# Patient Record
Sex: Male | Born: 1960 | Race: Black or African American | Hispanic: No | Smoking: Never smoker
Health system: Southern US, Community
[De-identification: ages and names within clinical notes are randomized; demographics above are authoritative.]

## PROBLEM LIST (undated history)

## (undated) HISTORY — PX: CYST EXCISION: SHX5701

---

## 2005-12-01 ENCOUNTER — Emergency Department: Payer: Self-pay | Admitting: Internal Medicine

## 2015-02-13 ENCOUNTER — Emergency Department
Admission: EM | Admit: 2015-02-13 | Discharge: 2015-02-13 | Disposition: A | Discharge status: Home / Self Care | Payer: Self-pay | Attending: Emergency Medicine | Admitting: Emergency Medicine

## 2015-02-13 ENCOUNTER — Encounter: Payer: Self-pay | Admitting: *Deleted

## 2015-02-13 DIAGNOSIS — M25551 Pain in right hip: Secondary | ICD-10-CM

## 2015-02-13 DIAGNOSIS — M5431 Sciatica, right side: Secondary | ICD-10-CM

## 2015-02-13 MED ORDER — PREDNISONE 20 MG PO TABS
60.0000 mg | ORAL_TABLET | Freq: Once | ORAL | Status: AC
  Administered 2015-02-13: 60 mg via ORAL
  Filled 2015-02-13: qty 3

## 2015-02-13 MED ORDER — PREDNISONE 10 MG PO TABS: 10.0000 mg | ORAL_TABLET | Freq: Every day | ORAL | Status: DC

## 2015-02-13 MED ORDER — TRAMADOL HCL 50 MG PO TABS: 50.0000 mg | ORAL_TABLET | Freq: Four times a day (QID) | ORAL | Status: AC | PRN

## 2015-02-13 MED ORDER — OXYCODONE-ACETAMINOPHEN 5-325 MG PO TABS
2.0000 | ORAL_TABLET | Freq: Once | ORAL | Status: AC
  Administered 2015-02-13: 2 via ORAL
  Filled 2015-02-13: qty 2

## 2015-02-13 NOTE — ED Notes (Signed)
Pt complains of right leg /hip pain since July

## 2015-02-13 NOTE — ED Provider Notes (Signed)
[facility] Emergency Department Provider Note     Time seen: ----------------------------------------- 7:18 PM on 02/13/2015 -----------------------------------------    I have reviewed the triage vital signs and the nursing notes.   HISTORY  Chief Complaint Leg Pain    HPI Kaniel Kiang is a 54 y.o. male who presents ER with right leg and hip pain since July. Patient states he has some numbness on the distal aspect of his right leg, most the pain is around his right leg and hip. 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   History reviewed. No pertinent past medical history.  There are no active problems to display for this patient.   History reviewed. No pertinent past surgical history.  Allergies Review of patient's allergies indicates no known allergies.  Social History Social History  Substance Use Topics  . Smoking status: Never Smoker   . Smokeless tobacco: None  . Alcohol Use: 7.2 oz/week    12 Cans of beer per week    Review of Systems Constitutional: Negative for fever. Eyes: Negative for visual changes. ENT: Negative for sore throat. Cardiovascular: Negative for chest pain. Respiratory: Negative for shortness of breath. Gastrointestinal: Negative for abdominal pain, vomiting and diarrhea. Genitourinary: Negative for dysuria. Musculoskeletal: Positive for right hip pain Skin: Negative for rash. Neurological: Negative for headaches, focal weakness or numbness.  10-point ROS otherwise negative.  ____________________________________________   PHYSICAL EXAM:  VITAL SIGNS: ED Triage Vitals  Enc Vitals Group     BP 02/13/15 1817 129/83 mmHg     Pulse Rate 02/13/15 1817 103     Resp 02/13/15 1817 20     Temp 02/13/15 1817 98.9 F (37.2 C)     Temp Source 02/13/15 1817 Oral     SpO2 02/13/15 1817 96 %     Weight 02/13/15 1817 195 lb (88.451 kg)     Height 02/13/15 1817  5\' 5"  (1.651 m)     Head Cir --      Peak Flow --      Pain Score 02/13/15 1818 3     Pain Loc --      Pain Edu? --      Excl. in GC? --     Constitutional: Alert and oriented. Well appearing and in no distress. Eyes: Conjunctivae are normal. PERRL. Normal extraocular movements. ENT   Head: Normocephalic and atraumatic.   Nose: No congestion/rhinnorhea.   Mouth/Throat: Mucous membranes are moist.   Neck: No stridor. Cardiovascular: Normal rate, regular rhythm. Normal and symmetric distal pulses are present in all extremities. No murmurs, rubs, or gallops. Respiratory: Normal respiratory effort without tachypnea nor retractions. Breath sounds are clear and equal bilaterally. No wheezes/rales/rhonchi. Gastrointestinal: Soft and nontender. No distention. No abdominal bruits.  Musculoskeletal: Mild pain with range of motion right hip, negative crossed straight leg raise examination. Neurologic:  Normal speech and language. No gross focal neurologic deficits are appreciated. Speech is normal. No gait instability. No sensory deficit is appreciated the right lower extremity Skin:  Skin is warm, dry and intact. No rash noted. Psychiatric: Mood and affect are normal. Speech and behavior are normal. Patient exhibits appropriate insight and judgment.  ____________________________________________  ED COURSE:  Pertinent labs & imaging results that were available during my care of the patient were reviewed by me and considered in my medical decision making (see chart for details). Patient likely with either arthritis or some degree of sciatica. He'll be  treated with steroids and pain medicine  ____________________________________________  FINAL ASSESSMENT AND PLAN  Hip pain, sciatica  Plan: Patient with labs and imaging as dictated above. Patient likely again with either sciatica or arthritis. He's can be discharged with steroids and pain medication. Is advised to follow-up with  his doctor or [facility] in the next week for reevaluation.   Emily Filbert, MD   Emily Filbert, MD 02/13/15 [phone]

## 2015-02-13 NOTE — Discharge Instructions (Signed)
Arthralgia °Your caregiver has diagnosed you as suffering from an arthralgia. Arthralgia means there is pain in a joint. This can come from many reasons including: °· Bruising the joint which causes soreness (inflammation) in the joint. °· Wear and tear on the joints which occur as we grow older (osteoarthritis). °· Overusing the joint. °· Various forms of arthritis. °· Infections of the joint. °Regardless of the cause of pain in your joint, most of these different pains respond to anti-inflammatory drugs and rest. The exception to this is when a joint is infected, and these cases are treated with antibiotics, if it is a bacterial infection. °HOME CARE INSTRUCTIONS  °· Rest the injured area for as long as directed by your caregiver. Then slowly start using the joint as directed by your caregiver and as the pain allows. Crutches as directed may be useful if the ankles, knees or hips are involved. If the knee was splinted or casted, continue use and care as directed. If an stretchy or elastic wrapping bandage has been applied today, it should be removed and re-applied every 3 to 4 hours. It should not be applied tightly, but firmly enough to keep swelling down. Watch toes and feet for swelling, bluish discoloration, coldness, numbness or excessive pain. If any of these problems (symptoms) occur, remove the ace bandage and re-apply more loosely. If these symptoms persist, contact your caregiver or return to this location. °· For the first 24 hours, keep the injured extremity elevated on pillows while lying down. °· Apply ice for 15-20 minutes to the sore joint every couple hours while awake for the first half day. Then 03-04 times per day for the first 48 hours. Put the ice in a plastic bag and place a towel between the bag of ice and your skin. °· Wear any splinting, casting, elastic bandage applications, or slings as instructed. °· Only take over-the-counter or prescription medicines for pain, discomfort, or fever as  directed by your caregiver. Do not use aspirin immediately after the injury unless instructed by your physician. Aspirin can cause increased bleeding and bruising of the tissues. °· If you were given crutches, continue to use them as instructed and do not resume weight bearing on the sore joint until instructed. °Persistent pain and inability to use the sore joint as directed for more than 2 to 3 days are warning signs indicating that you should see a caregiver for a follow-up visit as soon as possible. Initially, a hairline fracture (break in bone) may not be evident on X-rays. Persistent pain and swelling indicate that further evaluation, non-weight bearing or use of the joint (use of crutches or slings as instructed), or further X-rays are indicated. X-rays may sometimes not show a small fracture until a week or 10 days later. Make a follow-up appointment with your own caregiver or one to whom we have referred you. A radiologist (specialist in reading X-rays) may read your X-rays. Make sure you know how you are to obtain your X-ray results. Do not assume everything is normal if you do not hear from us. °SEEK MEDICAL CARE IF: °Bruising, swelling, or pain increases. °SEEK IMMEDIATE MEDICAL CARE IF:  °· Your fingers or toes are numb or blue. °· The pain is not responding to medications and continues to stay the same or get worse. °· The pain in your joint becomes severe. °· You develop a fever over 102° F (38.9° C). °· It becomes impossible to move or use the joint. °MAKE SURE YOU:  °·   Understand these instructions. °· Will watch your condition. °· Will get help right away if you are not doing well or get worse. °Document Released: 05/26/2005 Document Revised: 08/18/2011 Document Reviewed: 01/12/2008 °ExitCare® Patient Information ©2015 [employer]. This information is not intended to replace advice given to you by your health care provider. Make sure you discuss any questions you have with your health care  provider. °Sciatica °Sciatica is pain, weakness, numbness, or tingling along the path of the sciatic nerve. The nerve starts in the lower back and runs down the back of each leg. The nerve controls the muscles in the lower leg and in the back of the knee, while also providing sensation to the back of the thigh, lower leg, and the sole of your foot. Sciatica is a symptom of another medical condition. For instance, nerve damage or certain conditions, such as a herniated disk or bone spur on the spine, pinch or put pressure on the sciatic nerve. This causes the pain, weakness, or other sensations normally associated with sciatica. Generally, sciatica only affects one side of the body. °CAUSES  °· Herniated or slipped disc. °· Degenerative disk disease. °· A pain disorder involving the narrow muscle in the buttocks (piriformis syndrome). °· Pelvic injury or fracture. °· Pregnancy. °· Tumor (rare). °SYMPTOMS  °Symptoms can vary from mild to very severe. The symptoms usually travel from the low back to the buttocks and down the back of the leg. Symptoms can include: °· Mild tingling or dull aches in the lower back, leg, or hip. °· Numbness in the back of the calf or sole of the foot. °· Burning sensations in the lower back, leg, or hip. °· Sharp pains in the lower back, leg, or hip. °· Leg weakness. °· Severe back pain inhibiting movement. °These symptoms may get worse with coughing, sneezing, laughing, or prolonged sitting or standing. Also, being overweight may worsen symptoms. °DIAGNOSIS  °Your caregiver will perform a physical exam to look for common symptoms of sciatica. He or she may ask you to do certain movements or activities that would trigger sciatic nerve pain. Other tests may be performed to find the cause of the sciatica. These may include: °· Blood tests. °· X-rays. °· Imaging tests, such as an MRI or CT scan. °TREATMENT  °Treatment is directed at the cause of the sciatic pain. Sometimes, treatment is not  necessary and the pain and discomfort goes away on its own. If treatment is needed, your caregiver may suggest: °· Over-the-counter medicines to relieve pain. °· Prescription medicines, such as anti-inflammatory medicine, muscle relaxants, or narcotics. °· Applying heat or ice to the painful area. °· Steroid injections to lessen pain, irritation, and inflammation around the nerve. °· Reducing activity during periods of pain. °· Exercising and stretching to strengthen your abdomen and improve flexibility of your spine. Your caregiver may suggest losing weight if the extra weight makes the back pain worse. °· Physical therapy. °· Surgery to eliminate what is pressing or pinching the nerve, such as a bone spur or part of a herniated disk. °HOME CARE INSTRUCTIONS  °· Only take over-the-counter or prescription medicines for pain or discomfort as directed by your caregiver. °· Apply ice to the affected area for 20 minutes, 3-4 times a day for the first 48-72 hours. Then try heat in the same way. °· Exercise, stretch, or perform your usual activities if these do not aggravate your pain. °· Attend physical therapy sessions as directed by your caregiver. °· Keep all   follow-up appointments as directed by your caregiver. °· Do not wear high heels or shoes that do not provide proper support. °· Check your mattress to see if it is too soft. A firm mattress may lessen your pain and discomfort. °SEEK IMMEDIATE MEDICAL CARE IF:  °· You lose control of your bowel or bladder (incontinence). °· You have increasing weakness in the lower back, pelvis, buttocks, or legs. °· You have redness or swelling of your back. °· You have a burning sensation when you urinate. °· You have pain that gets worse when you lie down or awakens you at night. °· Your pain is worse than you have experienced in the past. °· Your pain is lasting longer than 4 weeks. °· You are suddenly losing weight without reason. °MAKE SURE YOU: °· Understand these  instructions. °· Will watch your condition. °· Will get help right away if you are not doing well or get worse. °Document Released: 05/20/2001 Document Revised: 11/25/2011 Document Reviewed: 10/05/2011 °ExitCare® Patient Information ©2015 [employer]. This information is not intended to replace advice given to you by your health care provider. Make sure you discuss any questions you have with your health care provider. ° °

## 2015-02-20 ENCOUNTER — Emergency Department: Payer: Self-pay

## 2015-02-20 ENCOUNTER — Encounter: Payer: Self-pay | Admitting: Emergency Medicine

## 2015-02-20 ENCOUNTER — Emergency Department
Admission: EM | Admit: 2015-02-20 | Discharge: 2015-02-20 | Disposition: A | Discharge status: Home / Self Care | Payer: Self-pay | Attending: Emergency Medicine | Admitting: Emergency Medicine

## 2015-02-20 DIAGNOSIS — S86911A Strain of unspecified muscle(s) and tendon(s) at lower leg level, right leg, initial encounter: Secondary | ICD-10-CM | POA: Insufficient documentation

## 2015-02-20 DIAGNOSIS — S86111A Strain of other muscle(s) and tendon(s) of posterior muscle group at lower leg level, right leg, initial encounter: Secondary | ICD-10-CM

## 2015-02-20 DIAGNOSIS — Y998 Other external cause status: Secondary | ICD-10-CM | POA: Insufficient documentation

## 2015-02-20 DIAGNOSIS — Y9289 Other specified places as the place of occurrence of the external cause: Secondary | ICD-10-CM | POA: Insufficient documentation

## 2015-02-20 DIAGNOSIS — Y9389 Activity, other specified: Secondary | ICD-10-CM | POA: Insufficient documentation

## 2015-02-20 DIAGNOSIS — Z7952 Long term (current) use of systemic steroids: Secondary | ICD-10-CM | POA: Insufficient documentation

## 2015-02-20 DIAGNOSIS — X58XXXA Exposure to other specified factors, initial encounter: Secondary | ICD-10-CM | POA: Insufficient documentation

## 2015-02-20 MED ORDER — CYCLOBENZAPRINE HCL 10 MG PO TABS: 10.0000 mg | ORAL_TABLET | Freq: Three times a day (TID) | ORAL | Status: AC | PRN

## 2015-02-20 MED ORDER — TRAMADOL HCL 50 MG PO TABS: 50.0000 mg | ORAL_TABLET | Freq: Four times a day (QID) | ORAL | Status: AC | PRN

## 2015-02-20 NOTE — ED Notes (Signed)
Pt to ed with c/o right hip and leg pain and tingling in leg x 2 months.  Pt reports mild swelling noted in right leg.

## 2015-02-20 NOTE — ED Provider Notes (Signed)
[facility] Emergency Department Provider Note  ____________________________________________  Time seen: Approximately 1:33 PM  I have reviewed the triage vital signs and the nursing notes.   HISTORY  Chief Complaint Leg Pain    HPI Marvin Howard is a 54 y.o. male patient complain of edema and pain to the right calf for 2 days. 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 Patient's had 2 months of right hip and leg pain and tingling in the right lower extremity.  . Patient stated he was unable to follow-up as directed from his last exam because he did not have his [employer] identification card. Patient state he has not obtained a car like a follow-up with her family doctor after this visit.Marvin Howard   History reviewed. No pertinent past medical history.  There are no active problems to display for this patient.   History reviewed. No pertinent past surgical history.  Current Outpatient Rx  Name  Route  Sig  Dispense  Refill  . cyclobenzaprine (FLEXERIL) 10 MG tablet   Oral   Take 1 tablet (10 mg total) by mouth every 8 (eight) hours as needed for muscle spasms.   15 tablet   0   . predniSONE (DELTASONE) 10 MG tablet   Oral   Take 1 tablet (10 mg total) by mouth daily.   21 tablet   0     Take 60mg  tomorrow, then decrease by 10mg  daily un ...   . traMADol (ULTRAM) 50 MG tablet   Oral   Take 1 tablet (50 mg total) by mouth every 6 (six) hours as needed.   20 tablet   0   . traMADol (ULTRAM) 50 MG tablet   Oral   Take 1 tablet (50 mg total) by mouth every 6 (six) hours as needed for moderate pain.   12 tablet   0     Allergies Review of patient's allergies indicates no known allergies.  History reviewed. No pertinent family history.  Social History Social History  Substance Use Topics  . Smoking status: Never Smoker   . Smokeless tobacco: None  . Alcohol Use: 7.2 oz/week    12 Cans of beer per week     Review of Systems Constitutional: No fever/chills Eyes: No visual changes. ENT: No sore throat. Cardiovascular: Denies chest pain. Respiratory: Denies shortness of breath. Gastrointestinal: No abdominal pain.  No nausea, no vomiting.  No diarrhea.  No constipation. Genitourinary: Negative for dysuria. Musculoskeletal: Negative for back pain. Skin: Negative for rash. Neurological: Negative for headaches, focal weakness or numbness.  10-point ROS otherwise negative.  ____________________________________________   PHYSICAL EXAM:  VITAL SIGNS: ED Triage Vitals  Enc Vitals Group     BP 02/20/15 1248 136/88 mmHg     Pulse Rate 02/20/15 1246 94     Resp 02/20/15 1248 20     Temp 02/20/15 1246 98.9 F (37.2 C)     Temp Source 02/20/15 1246 Oral     SpO2 02/20/15 1248 98 %     Weight 02/20/15 1248 180 lb (81.647 kg)     Height 02/20/15 1248 5\' 4"  (1.626 m)     Head Cir --      Peak Flow --      Pain Score 02/20/15 1249 10     Pain Loc --      Pain Edu? --      Excl. in GC? --     Constitutional: Alert and oriented. Well appearing and  in no acute distress. Eyes: Conjunctivae are normal. PERRL. EOMI. Head: Atraumatic. Nose: No congestion/rhinnorhea. Mouth/Throat: Mucous membranes are moist.  Oropharynx non-erythematous. Neck: No stridor.   Cardiovascular: Normal rate, regular rhythm. Grossly normal heart sounds.  Good peripheral circulation. Respiratory: Normal respiratory effort.  No retractions. Lungs CTAB. Gastrointestinal: Soft and nontender. No distention. No abdominal bruits. No CVA tenderness. Musculoskeletal: No obvious deformity there is some moderate edema to the lower right extremity. Patient . Patient has some guarding with palpation of the right calf. Distal pulses were intact. Neurologic:  Normal speech and language. No gross focal neurologic deficits are appreciated. No gait instability. Skin:  Skin is warm, dry and intact. No rash noted. Psychiatric: Mood  and affect are normal. Speech and behavior are normal.  ____________________________________________   LABS (all labs ordered are listed, but only abnormal results are displayed)  Labs Reviewed - No data to display ____________________________________________  EKG   ____________________________________________  RADIOLOGY  Venous ultrasound was negative for DVT. ____________________________________________   PROCEDURES  Procedure(s) performed: None  Critical Care performed: No  ____________________________________________   INITIAL IMPRESSION / ASSESSMENT AND PLAN / ED COURSE  Pertinent labs & imaging results that were available during my care of the patient were reviewed by me and considered in my medical decision making (see chart for details).  _______Gastrocnemius  Muscle strain right leg.patient given advice on home care. Patient given a prescription for Flexeril and tramadol. He shouldn't work note for 2 days. Patient is advised to follow-up with [facility] clinic. _____________________________________   FINAL CLINICAL IMPRESSION(S) / ED DIAGNOSES  Final diagnoses:  Gastrocnemius muscle strain, right, initial encounter      Marvin Reining, PA-C 02/20/15 1512  Marvin Natal, MD 02/20/15 [phone]

## 2015-02-27 ENCOUNTER — Other Ambulatory Visit: Payer: Self-pay | Admitting: Physician Assistant

## 2015-02-27 DIAGNOSIS — R200 Anesthesia of skin: Secondary | ICD-10-CM

## 2015-02-27 DIAGNOSIS — M79604 Pain in right leg: Secondary | ICD-10-CM

## 2015-02-27 DIAGNOSIS — M545 Low back pain: Secondary | ICD-10-CM

## 2015-02-27 DIAGNOSIS — R202 Paresthesia of skin: Secondary | ICD-10-CM

## 2015-03-05 ENCOUNTER — Ambulatory Visit
Admission: RE | Admit: 2015-03-05 | Discharge: 2015-03-05 | Disposition: A | Discharge status: Home / Self Care | Payer: BLUE CROSS/BLUE SHIELD | Source: Ambulatory Visit | Attending: Physician Assistant | Admitting: Physician Assistant

## 2015-03-05 ENCOUNTER — Ambulatory Visit: Payer: BLUE CROSS/BLUE SHIELD

## 2015-03-05 DIAGNOSIS — M79604 Pain in right leg: Secondary | ICD-10-CM

## 2015-03-05 DIAGNOSIS — R202 Paresthesia of skin: Secondary | ICD-10-CM

## 2015-03-05 DIAGNOSIS — M545 Low back pain: Secondary | ICD-10-CM

## 2015-03-05 DIAGNOSIS — M5126 Other intervertebral disc displacement, lumbar region: Secondary | ICD-10-CM | POA: Insufficient documentation

## 2015-03-05 DIAGNOSIS — R200 Anesthesia of skin: Secondary | ICD-10-CM

## 2015-09-17 ENCOUNTER — Emergency Department: Payer: BLUE CROSS/BLUE SHIELD

## 2015-09-17 ENCOUNTER — Emergency Department
Admission: EM | Admit: 2015-09-17 | Discharge: 2015-09-17 | Disposition: A | Discharge status: Home / Self Care | Payer: BLUE CROSS/BLUE SHIELD | Attending: Emergency Medicine | Admitting: Emergency Medicine

## 2015-09-17 DIAGNOSIS — M7652 Patellar tendinitis, left knee: Secondary | ICD-10-CM | POA: Insufficient documentation

## 2015-09-17 DIAGNOSIS — M25562 Pain in left knee: Secondary | ICD-10-CM | POA: Diagnosis present

## 2015-09-17 MED ORDER — PREDNISONE 10 MG PO TABS: ORAL_TABLET | ORAL | Status: AC

## 2015-09-17 NOTE — ED Notes (Signed)
Pt c/o pain and swelling to the left knee for the past 2 weeks.. Denies injury..[name]

## 2015-09-17 NOTE — ED Provider Notes (Signed)
[facility] Emergency Department Provider Note  ____________________________________________  Time seen: Approximately 2:34 PM  I have reviewed the triage vital signs and the nursing notes.   HISTORY  Chief Complaint Knee Pain    HPI Marvin Howard is a 55 y.o. male , NAD, presents to the emergency department with 2 week history of swelling and pain to the left knee. Denies any injury, trauma, falls. Has not noted any redness or warmth. No skin lesions. Denies fever, chills, body aches. Denies numbness, weakness, tingling. States the area hurts more when he applies weight to that extremity. Has not taken anything over-the-counter for pain at this time. Has completed no home treatment at this time.   History reviewed. No pertinent past medical history.  There are no active problems to display for this patient.   Past Surgical History  Procedure Laterality Date  . Cyst excision      Current Outpatient Rx  Name  Route  Sig  Dispense  Refill  . cyclobenzaprine (FLEXERIL) 10 MG tablet   Oral   Take 1 tablet (10 mg total) by mouth every 8 (eight) hours as needed for muscle spasms.   15 tablet   0   . predniSONE (DELTASONE) 10 MG tablet      Take a daily regimen of 6,5,4,3,2,1   21 tablet   0   . traMADol (ULTRAM) 50 MG tablet   Oral   Take 1 tablet (50 mg total) by mouth every 6 (six) hours as needed.   20 tablet   0   . traMADol (ULTRAM) 50 MG tablet   Oral   Take 1 tablet (50 mg total) by mouth every 6 (six) hours as needed for moderate pain.   12 tablet   0     Allergies Review of patient's allergies indicates no known allergies.  No family history on file.  Social History Social History  Substance Use Topics  . Smoking status: Never Smoker   . Smokeless tobacco: None  . Alcohol Use: 7.2 oz/week    12 Cans of beer per week     Review of Systems  Constitutional: No fever/chills, fatigue Cardiovascular: No chest  pain. Respiratory:  No shortness of breath.  Musculoskeletal: Positive left knee pain.  Skin: Positive swelling left knee. Negative for rash, redness, warmth, skin sores. Neurological: Negative for headaches, focal weakness or numbness. No tingling 10-point ROS otherwise negative.  ____________________________________________   PHYSICAL EXAM:  VITAL SIGNS: ED Triage Vitals  Enc Vitals Group     BP 09/17/15 1332 140/92 mmHg     Pulse Rate 09/17/15 1332 100     Resp 09/17/15 1332 17     Temp 09/17/15 1332 98.2 F (36.8 C)     Temp Source 09/17/15 1332 Oral     SpO2 09/17/15 1332 97 %     Weight 09/17/15 1332 210 lb (95.255 kg)     Height 09/17/15 1332 5\' 6"  (1.676 m)     Head Cir --      Peak Flow --      Pain Score 09/17/15 1333 8     Pain Loc --      Pain Edu? --      Excl. in GC? --     Constitutional: Alert and oriented. Well appearing and in no acute distress. Eyes: Conjunctivae are normal.  Head: Atraumatic. Cardiovascular:  Good peripheral circulation with 2+ pulses noted in the left lower extremity. Respiratory: Normal respiratory effort without tachypnea or  retractions.  Musculoskeletal: 2 cm oblong swelling below the left patella. Slightly warm to palpation but no fluctuance or induration. No crepitus or pain with manipulation of the left patella. FROM of the left knee without crepitus with manipulation. Negative anterior and posterior drawer. No laxity with varus or valgus stress. No lower extremity tenderness nor edema.  No joint effusions. Neurologic:  Normal speech and language. No gross focal neurologic deficits are appreciated.  Skin:  Skin is warm, dry and intact. No rash or skin sores, oozing, weeping noted. Psychiatric: Mood and affect are normal. Speech and behavior are normal. Patient exhibits appropriate insight and judgement.   ____________________________________________    LABS  None  ____________________________________________  EKG  None ____________________________________________  RADIOLOGY I have personally viewed and evaluated these images (plain radiographs) as part of my medical decision making, as well as reviewing the written report by the radiologist.  Dg Knee Complete 4 Views Left  09/17/2015  CLINICAL DATA:  Pain and swelling in the left knee for 2 weeks. No known injury. Increased pain with weight-bearing. EXAM: LEFT KNEE - COMPLETE 4+ VIEW COMPARISON:  None. FINDINGS: The bones appear normal. There is soft tissue prominence in the region of the patellar tendon and of the anterior tibial tubercle. No joint effusion. IMPRESSION: Soft tissue swelling along the patellar tendon and at the patellar tendon insertion. The finding suggests patellar tendinopathy. Electronically Signed   By: Francene Boyers M.D.   On: 09/17/2015 15:11    ____________________________________________    PROCEDURES  Procedure(s) performed: None     Medications - No data to display   ____________________________________________   INITIAL IMPRESSION / ASSESSMENT AND PLAN / ED COURSE  Pertinent imaging results that were available during my care of the patient were reviewed by me and considered in my medical decision making (see chart for details).  Patient's diagnosis is consistent with left patellar tendinitis. Patient was placed in an Ace wrap for support. Patient will be discharged home with prescriptions for prednisone to take as directed. May take Tylenol as needed for pain. Patient is to follow up with Dr. Ann Lions in orthopedics in 2-3 days if no improvement has been noted. Patient was given a work note to excuse from work today and tomorrow to allow him to rest the knee as well as to ice and elevate it at home. Patient is given ED precautions to return to the ED for any worsening or new symptoms.       ____________________________________________  FINAL CLINICAL IMPRESSION(S) / ED DIAGNOSES  Final diagnoses:  Patellar tendinitis, left      NEW MEDICATIONS STARTED DURING THIS VISIT:  Discharge Medication List as of 09/17/2015  3:21 PM           Hope Pigeon, PA-C 09/17/15 1609  Jene Every, MD 09/18/15 1442

## 2015-09-17 NOTE — Discharge Instructions (Signed)
Patellar Tendinitis With Rehab  Tendinitis is inflammation of a tendon. Tendonitis of the tendon below the kneecap (patella) is known as patellar tendonitis. Patellar tendonitis is also called jumper's knee. Jumper's knee is a common cause of pain below the kneecap (infrapatellar). Jumper's knee may involve a tear (strain) in the ligament. Strains are classified into three categories. Grade 1 strains cause pain, but the tendon is not lengthened. Grade 2 strains include a lengthened ligament, due to the ligament being stretched or partially ruptured. With grade 2 strains there is still function, although function may be decreased. Grade 3 strains involve a complete tear of the tendon or muscle, and function is usually impaired. Patellar tendon strains are usually grade 1 or 2.   SYMPTOMS   · Pain, tenderness, swelling, warmth, or redness over the patellar tendon (just below the kneecap).  · Pain and loss of strength (sometimes), with forcefully straightening the knee (especially when jumping or rising from a seated or squatting position), or bending the knee completely (squatting or kneeling).  · Crackling sound (crepitation) when the tendon is moved or touched.  CAUSES   Patellar tendonitis is caused by injury to the patellar tendon. The inflammation is the body's healing response. Common causes of injury include:  · Stress from a sudden increase in intensity, frequency, or duration of training.  · Overuse of the thigh muscles (quadriceps) and patellar tendon.  · Direct hit (trauma) to the knee or patellar tendon.  RISK INCREASES WITH:  · Sports that require sudden, explosive quadriceps contraction, such as jumping, quick starts, or kicking.  · Running sports, especially running down hills.  · Poor strength and flexibility of the thigh and knee.  · Flat feet.  PREVENTION  · Warm up and stretch properly before activity.  · Allow for adequate recovery between workouts.  · Maintain physical fitness:    Strength,  flexibility, and endurance.    Cardiovascular fitness.  · Protect the knee joint with taping, protective strapping, bracing, or elastic compression bandage.  · Wear arch supports (orthotics).  PROGNOSIS   If treated properly, patellar tendonitis usually heals within 6 weeks.   RELATED COMPLICATIONS   · Longer healing time if not properly treated or if not given enough time to heal.  · Recurring symptoms if activity is resumed too soon, with overuse, with a direct blow, or when using poor technique.  · If untreated, tendon rupture requiring surgery.  TREATMENT  Treatment first involves the use of ice and medicine to reduce pain and inflammation. The use of strengthening and stretching exercises may help reduce pain with activity. These exercises may be performed at home or with a therapist. Serious cases of tendonitis may require restraining the knee for 10 to 14 days to prevent stress on the tendon and to promote healing. Crutches may be used (uncommon) until you can walk without a limp. For cases in which nonsurgical treatment is unsuccessful, surgery may be advised to remove the inflamed tendon lining (sheath). Surgery is rare, and is only advised after at least 6 months of nonsurgical treatment.  MEDICATION   · If pain medicine is needed, nonsteroidal anti-inflammatory medicines (aspirin and ibuprofen), or other minor pain relievers (acetaminophen), are often advised.  · Do not take pain medicine for 7 days before surgery.  · Prescription pain relievers may be given if your caregiver thinks they are needed. Use only as directed and only as much as you need.  HEAT AND COLD  · Cold treatment (icing)   should be applied for 10 to 15 minutes every 2 to 3 hours for inflammation and pain, and immediately after activity that aggravates your symptoms. Use ice packs or an ice massage.  · Heat treatment may be used before performing stretching and strengthening activities prescribed by your caregiver, physical therapist, or  athletic trainer. Use a heat pack or a warm water soak.  SEEK MEDICAL CARE IF:  · Symptoms get worse or do not improve in 2 weeks, despite treatment.  · New, unexplained symptoms develop. (Drugs used in treatment may produce side effects.)  EXERCISES  RANGE OF MOTION (ROM) AND STRETCHING EXERCISES - Patellar Tendinitis (Jumper's Knee)  These are some of the initial exercises with which you may start your rehabilitation program, until you see your caregiver again or until your symptoms are resolved. Remember:   · Flexible tissue is more tolerant of the stresses placed on it during activities.  · Each stretch should be held for 20 to 30 seconds.  · A gentle stretching sensation should be felt.  STRETCH - Hamstrings, Supine  · Lie on your back. Loop a belt or towel over the ball of your right / left foot.  · Straighten your right / left knee and slowly pull on the belt to raise your leg. Do not allow the right / left knee to bend. Keep your opposite leg flat on the floor.  · Raise the leg until you feel a gentle stretch behind your right / left knee or thigh. Hold this position for __________ seconds.  Repeat __________ times. Complete this stretch __________ times per day.   STRETCH - Hamstrings, Doorway  · Lie on your back with your right / left leg extended and resting on the wall, and the opposite leg flat on the ground through the door. At first, position your bottom farther away from the wall.  · Keep your right / left knee straight. If you feel a stretch behind your knee or thigh, hold this position for __________ seconds.  · If you do not feel a stretch, scoot your bottom closer to the door, and hold __________ seconds.  Repeat __________ times. Complete this stretch __________ times per day.   STRETCH - Hamstrings, Standing  · Stand or sit and extend your right / left leg, placing your foot on a chair or foot stool.  · Keep a slight arch in your low back and your hips straight forward.  · Lead with your chest  and lean forward at the waist until you feel a gentle stretch in the back of your right / left knee or thigh. (When done correctly, this exercise requires leaning only a small distance.)  · Hold this position for __________ seconds.  Repeat __________ times. Complete this stretch __________ times per day.  STRETCH - Adductors, Lunge  · While standing, spread your legs, with your right / left leg behind you.  · Lean away from your right / left leg by bending your opposite knee. You may rest your hands on your thigh for balance.  · You should feel a stretch in your right / left inner thigh. Hold for __________ seconds.  Repeat __________ times. Complete this exercise __________ times per day.   STRENGTHENING EXERCISES - Patellar Tendinitis (Jumper's Knee)  These exercises may help you when beginning to rehabilitate your injury. They may resolve your symptoms with or without further involvement from your physician, physical therapist or athletic trainer. While completing these exercises, remember:   · Muscles   can gain both the endurance and the strength needed for everyday activities through controlled exercises.  · Complete these exercises as instructed by your physician, physical therapist or athletic trainer. Increase the resistance and repetitions only as guided by your caregiver.  STRENGTH - Quadriceps, Isometrics  · Lie on your back with your right / left leg extended and your opposite knee bent.  · Gradually tense the muscles in the front of your right / left thigh. You should see either your kneecap slide up toward your hip or increased dimpling just above the knee. This motion will push the back of the knee down toward the floor, mat, or bed on which you are lying.  · Hold the muscle as tight as you can, without increasing your pain, for __________ seconds.  · Relax the muscles slowly and completely in between each repetition.  Repeat __________ times. Complete this exercise __________ times per day.      STRENGTH - Quadriceps, Short Arcs  · Lie on your back. Place a __________ inch towel roll under your right / left knee, so that the knee bends slightly.  · Raise only your lower leg by tightening the muscles in the front of your thigh. Do not allow your thigh to rise.  · Hold this position for __________ seconds.  Repeat __________ times. Complete this exercise __________ times per day.   OPTIONAL ANKLE WEIGHTS: Begin with ____________________, but DO NOT exceed ____________________. Increase in 1 pound/ 0.5 kilogram increments.  STRENGTH - Quadriceps, Straight Leg Raises   Quality counts! Watch for signs that the quadriceps muscle is working, to be sure you are strengthening the correct muscles and not "cheating" by substituting with healthier muscles.  · Lay on your back with your right / left leg extended and your opposite knee bent.  · Tense the muscles in the front of your right / left thigh. You should see either your kneecap slide up or increased dimpling just above the knee. Your thigh may even shake a bit.  · Tighten these muscles even more and raise your leg 4 to 6 inches off the floor. Hold for __________ seconds.  · Keeping these muscles tense, lower your leg.  · Relax the muscles slowly and completely between each repetition.  Repeat __________ times. Complete this exercise __________ times per day.   STRENGTH - Quadriceps, Squats  · Stand in a door frame so that your feet and knees are in line with the frame.  · Use your hands for balance, not support, on the frame.  · Slowly lower your weight, bending at the hips and knees. Keep your lower legs upright so that they are parallel with the door frame. Squat only within the range that does not increase your knee pain. Never let your hips drop below your knees.  · Slowly return upright, pushing with your legs, not pulling with your hands.  Repeat __________ times. Complete this exercise __________ times per day.   STRENGTH - Quadriceps,  Step-Downs  · Stand on the edge of a step stool or stair. Be prepared to use a countertop or wall for balance, if needed.  · Keeping your right / left knee directly over the middle of your foot, slowly touch your opposite heel to the floor or lower step. Do not go all the way to the floor if your knee pain increases; just go as far as you can without increased discomfort. Use your right / left leg muscles, not gravity to lower your   body weight.  · Slowly push your body weight back up to the starting position.  Repeat __________ times. Complete this exercise __________ times per day.      This information is not intended to replace advice given to you by your health care provider. Make sure you discuss any questions you have with your health care provider.     Document Released: 05/26/2005 Document Revised: 10/10/2014 Document Reviewed: 09/07/2008  [employer]vier Interactive Patient Education ©2016 [employer]

## 2017-06-09 IMAGING — US US EXTREM LOW VENOUS*R*
1 series · 14 of 24 positions shown · non-contrast
Comparison: None.

CLINICAL DATA: Right pain and calf edema

EXAM:
RIGHT LOWER EXTREMITY VENOUS DUPLEX ULTRASOUND
TECHNIQUE: Doppler venous assessment of the right lower extremity deep venous
system was performed, including characterization of spectral flow,
compressibility, and phasicity.

[Series 1: us extrem low venous*right* · 0.08mm/px · 14 of 33 slices shown]
[im 1/33]
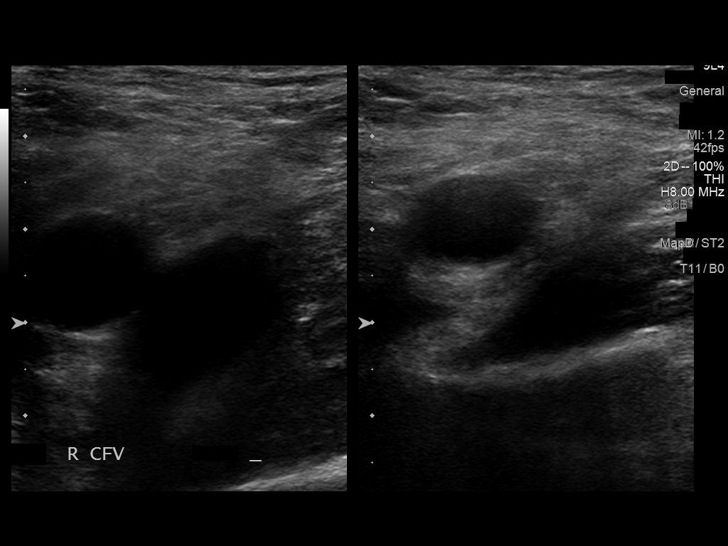
[im 3/33]
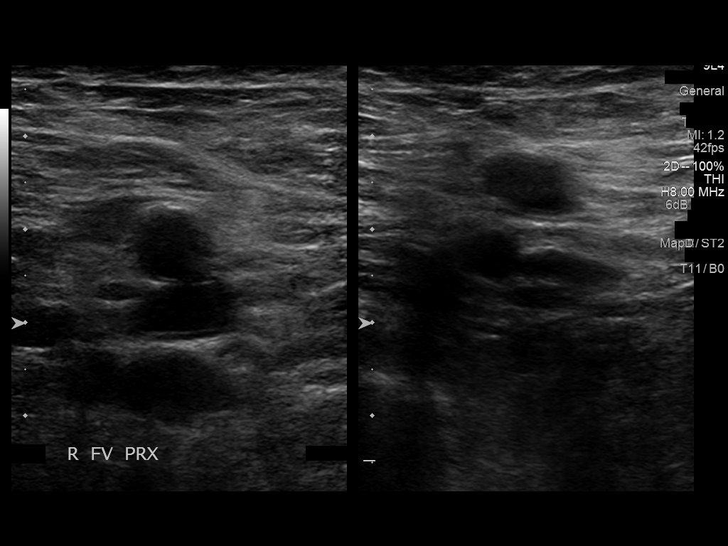
[im 6/33]
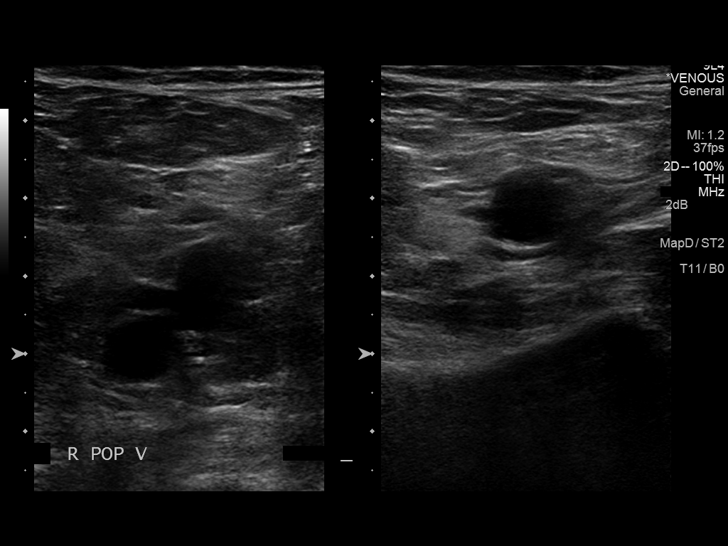
[im 9/33]
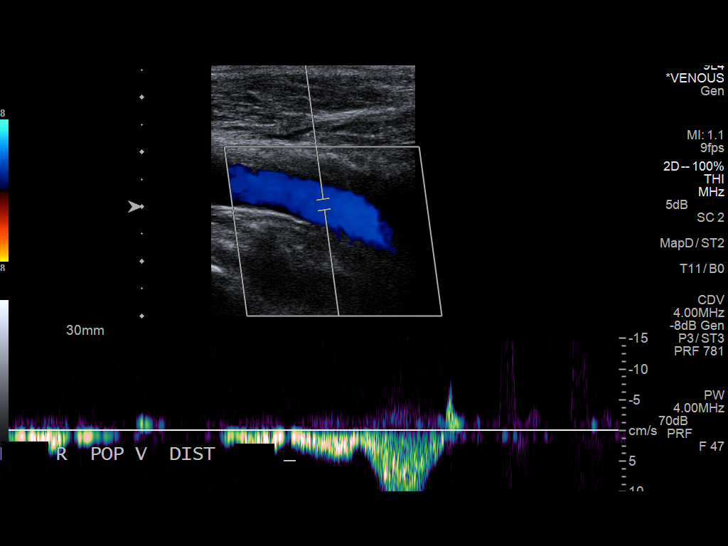
[im 10/33]
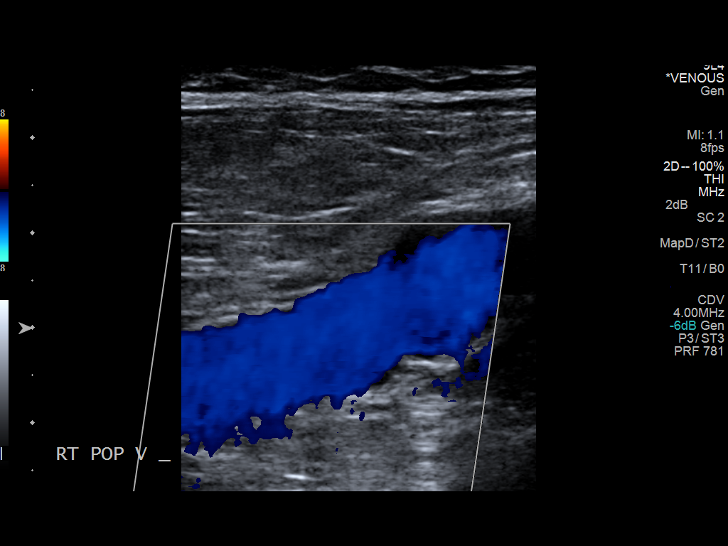
[im 13/33]
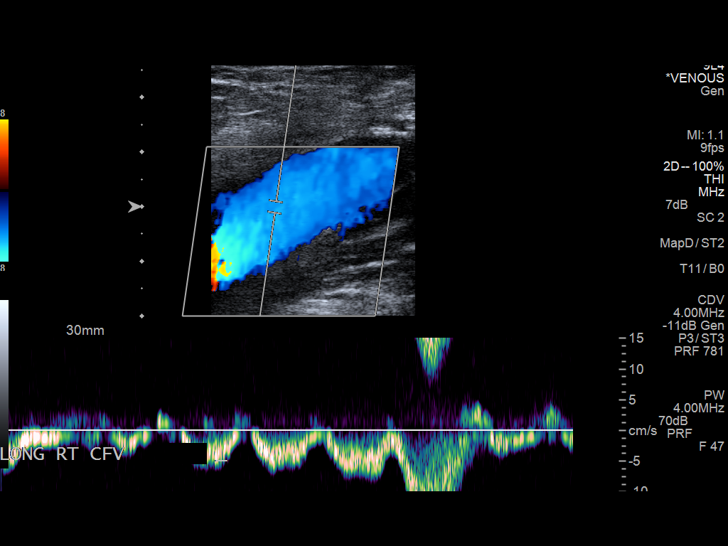
[im 16/33]
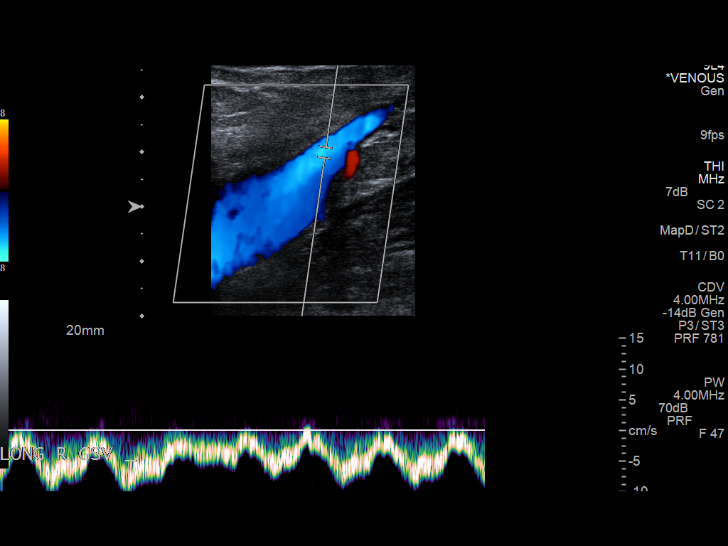
[im 17/33]
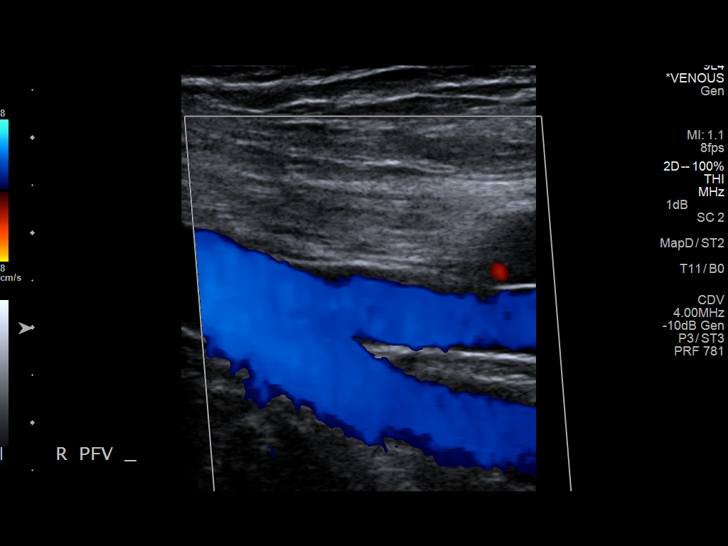
[im 20/33]
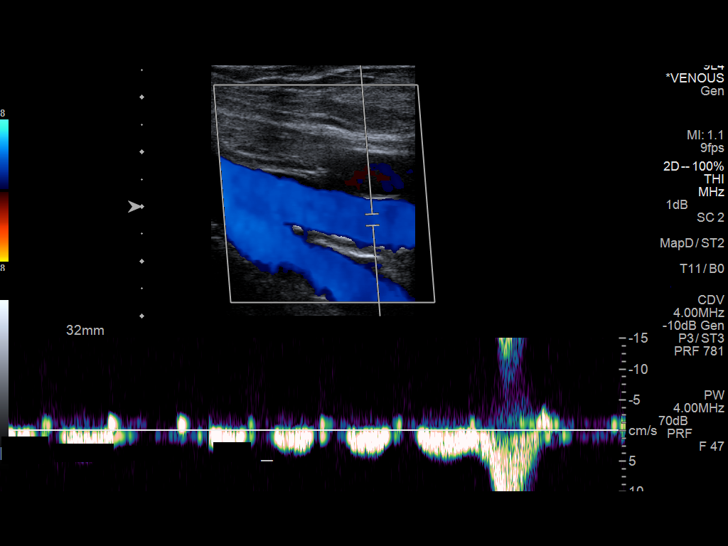
[im 23/33]
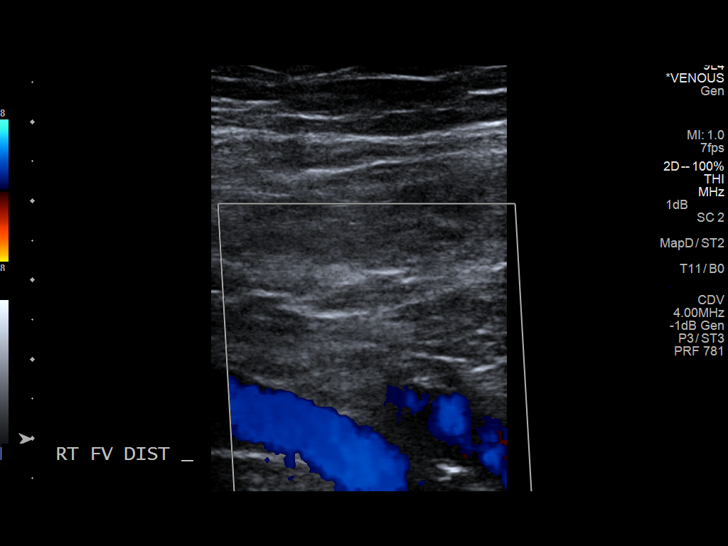
[im 26/33]
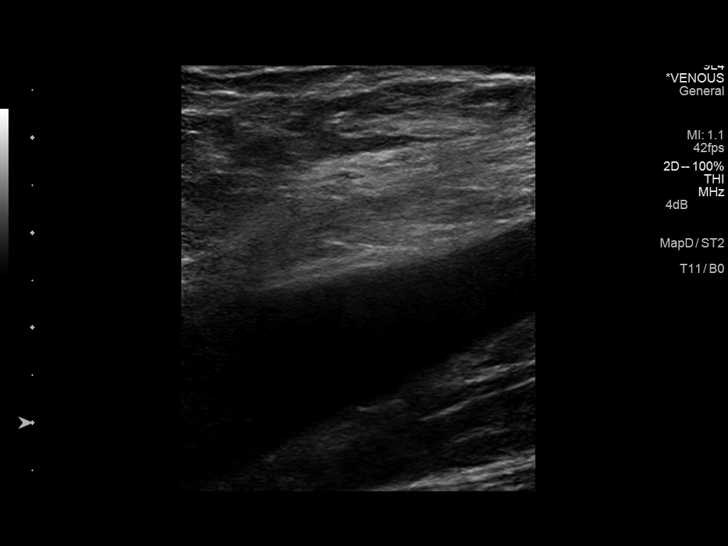
[im 27/33]
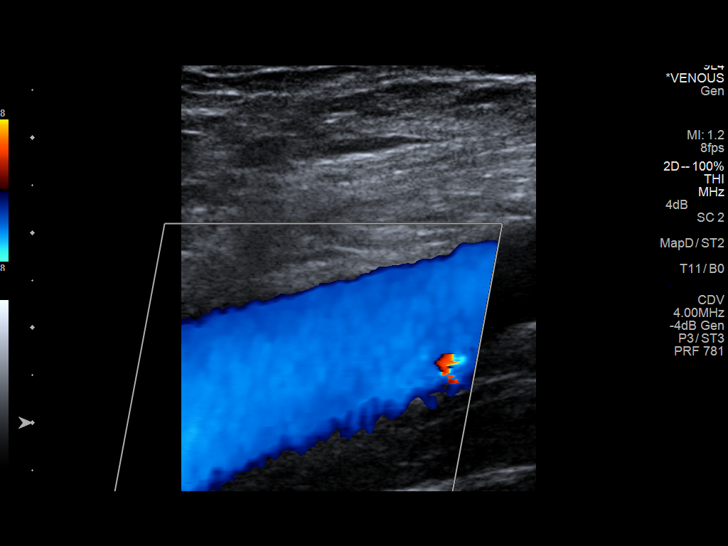
[im 30/33]
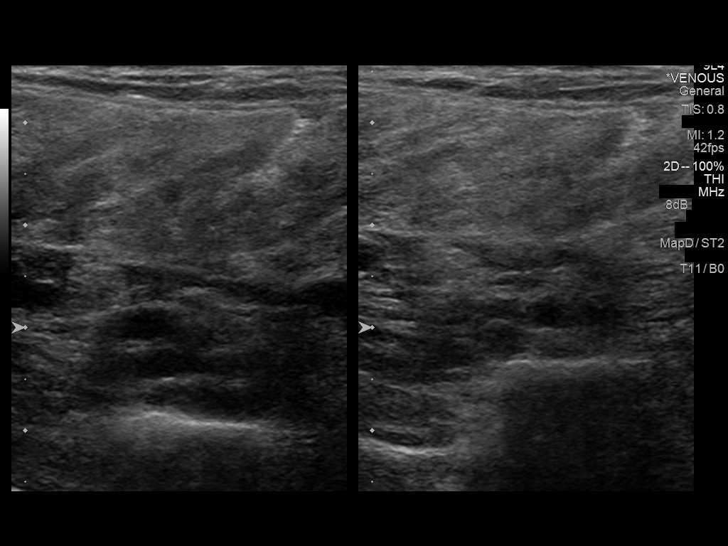
[im 33/33]
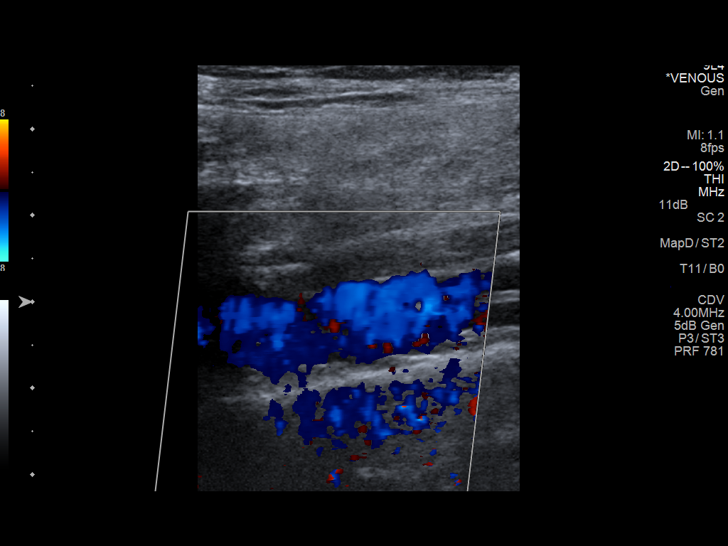

[14 of 24 positions shown; findings below may reference images not displayed]

FINDINGS: There is complete compressibility of the right common femoral,
femoral, and popliteal veins. Doppler analysis demonstrates
respiratory phasicity and augmentation of flow upon calf
compression.
IMPRESSION: No evidence of right lower extremity DVT.

## 2017-07-02 ENCOUNTER — Emergency Department
Admission: EM | Admit: 2017-07-02 | Discharge: 2017-07-02 | Disposition: A | Discharge status: Home / Self Care | Payer: BLUE CROSS/BLUE SHIELD | Attending: Student in an Organized Health Care Education/Training Program | Admitting: Student in an Organized Health Care Education/Training Program

## 2017-07-02 ENCOUNTER — Other Ambulatory Visit: Payer: Self-pay

## 2017-07-02 ENCOUNTER — Encounter: Payer: Self-pay | Admitting: Emergency Medicine

## 2017-07-02 DIAGNOSIS — J019 Acute sinusitis, unspecified: Secondary | ICD-10-CM

## 2017-07-02 DIAGNOSIS — Z79899 Other long term (current) drug therapy: Secondary | ICD-10-CM | POA: Insufficient documentation

## 2017-07-02 MED ORDER — FLUTICASONE PROPIONATE 50 MCG/ACT NA SUSP: 2.0000 | Freq: Every day | NASAL | 0 refills | Status: AC

## 2017-07-02 MED ORDER — AMOXICILLIN-POT CLAVULANATE 875-125 MG PO TABS: 1.0000 | ORAL_TABLET | Freq: Two times a day (BID) | ORAL | 0 refills | Status: AC

## 2017-07-02 NOTE — ED Triage Notes (Signed)
Pt arrived via pov with complaints of body aches, food aversion, sinus headache, and runny nose. Pt has been treating with OTC medications with minimal relief.

## 2017-07-02 NOTE — ED Provider Notes (Signed)
[facility] Emergency Department Provider Note  ____________________________________________  Time seen: Approximately 4:01 PM  I have reviewed the triage vital signs and the nursing notes.   HISTORY  Chief Complaint Sinusitis and Generalized Body Aches    HPI Marvin Howard is a 57 y.o. male that presents to the emergency department for 2 weeks of nasal congestion.  He has had what feels like a sinus headache today and he is spitting out a lot of phlegm. Patient says that he does not have an appetite but wife states that it "has not slowed his eating down." He has a lot of coworkers who are sick with cold symptoms.  He did not go to work yesterday due to feeling ill.  He has taken over-the-counter medications without relief.  No fever, sore throat, cough, shortness of breath, chest pain, nausea, vomiting, abdominal pain.  History reviewed. No pertinent past medical history.  There are no active problems to display for this patient.   Past Surgical History:  Procedure Laterality Date  . CYST EXCISION      Prior to Admission medications   Medication Sig Start Date End Date Taking? Authorizing Provider  amoxicillin-clavulanate (AUGMENTIN) 875-125 MG tablet Take 1 tablet by mouth 2 (two) times daily for 10 days. 07/02/17 07/12/17  Enid DerryWagner, Kerri Kovacik, PA-C  cyclobenzaprine (FLEXERIL) 10 MG tablet Take 1 tablet (10 mg total) by mouth every 8 (eight) hours as needed for muscle spasms. 02/20/15   Joni ReiningSmith, Ronald K, PA-C  fluticasone (FLONASE) 50 MCG/ACT nasal spray Place 2 sprays into both nostrils daily. 07/02/17 07/02/18  Enid DerryWagner, Terreon Ekholm, PA-C  predniSONE (DELTASONE) 10 MG tablet Take a daily regimen of 6,5,4,3,2,1 09/17/15   Hagler, Jami L, PA-C  traMADol (ULTRAM) 50 MG tablet Take 1 tablet (50 mg total) by mouth every 6 (six) hours as needed for moderate pain. 02/20/15   Joni ReiningSmith, Ronald K, PA-C    Allergies Patient has no known allergies.  No family history on  file.  Social History Social History   Tobacco Use  . Smoking status: Never Smoker  . Smokeless tobacco: Never Used  Substance Use Topics  . Alcohol use: Yes    Alcohol/week: 7.2 oz    Types: 12 Cans of beer per week  . Drug use: No     Review of Systems  Constitutional: No fever/chills ENT: Positive for congestion and rhinorrhea. Cardiovascular: No chest pain. Respiratory: Negative for cough. No SOB. Gastrointestinal: No abdominal pain.  No nausea, no vomiting.  No diarrhea.  No constipation. Musculoskeletal: Negative for musculoskeletal pain. Skin: Negative for rash, abrasions, lacerations, ecchymosis.   ____________________________________________   PHYSICAL EXAM:  VITAL SIGNS: ED Triage Vitals  Enc Vitals Group     BP 07/02/17 1513 (!) 143/86     Pulse Rate 07/02/17 1513 75     Resp 07/02/17 1513 20     Temp 07/02/17 1513 98.1 F (36.7 C)     Temp Source 07/02/17 1513 Oral     SpO2 07/02/17 1513 98 %     Weight 07/02/17 1514 214 lb (97.1 kg)     Height 07/02/17 1514 5\' 7"  (1.702 m)     Head Circumference --      Peak Flow --      Pain Score 07/02/17 1516 8     Pain Loc --      Pain Edu? --      Excl. in GC? --      Constitutional: Alert and oriented. Well appearing and  in no acute distress. Eyes: Conjunctivae are normal. PERRL. EOMI. No discharge. Head: Atraumatic. ENT: No frontal and maxillary sinus tenderness.      Ears: Tympanic membranes pearly gray with good landmarks. No discharge.      Nose: Mild congestion/rhinnorhea.      Mouth/Throat: Mucous membranes are moist. Oropharynx non-erythematous. Tonsils not enlarged. No exudates. Uvula midline. Neck: No stridor.   Hematological/Lymphatic/Immunilogical: No cervical lymphadenopathy. Cardiovascular: Normal rate, regular rhythm.  Good peripheral circulation. Respiratory: Normal respiratory effort without tachypnea or retractions. Lungs CTAB. Good air entry to the bases with no decreased or absent  breath sounds. Gastrointestinal: Bowel sounds 4 quadrants. Soft and nontender to palpation. No guarding or rigidity. No palpable masses. No distention. Musculoskeletal: Full range of motion to all extremities. No gross deformities appreciated. Neurologic:  Normal speech and language. No gross focal neurologic deficits are appreciated.  Skin:  Skin is warm, dry and intact. No rash noted.  ____________________________________________   LABS (all labs ordered are listed, but only abnormal results are displayed)  Labs Reviewed - No data to display ____________________________________________  EKG   ____________________________________________  RADIOLOGY   No results found.  ____________________________________________    PROCEDURES  Procedure(s) performed:    Procedures    Medications - No data to display   ____________________________________________   INITIAL IMPRESSION / ASSESSMENT AND PLAN / ED COURSE  Pertinent labs & imaging results that were available during my care of the patient were reviewed by me and considered in my medical decision making (see chart for details).  Review of the [place] CSRS was performed in accordance of the [facility] prior to dispensing any controlled drugs.    Patient's diagnosis is consistent with sinus infection. Vital signs and exam are reassuring. Patient feels comfortable going home. Patient will be discharged home with prescriptions for Augmentin and Flonase. Patient is to follow up with PCP as needed or otherwise directed. Patient is given ED precautions to return to the ED for any worsening or new symptoms.     ____________________________________________  FINAL CLINICAL IMPRESSION(S) / ED DIAGNOSES  Final diagnoses:  Acute non-recurrent sinusitis, unspecified location      NEW MEDICATIONS STARTED DURING THIS VISIT:  ED Discharge Orders        Ordered    amoxicillin-clavulanate (AUGMENTIN) 875-125 MG tablet  2 times  daily     07/02/17 1602    fluticasone (FLONASE) 50 MCG/ACT nasal spray  Daily     07/02/17 1602          This chart was dictated using voice recognition software/Dragon. Despite best efforts to proofread, errors can occur which can change the meaning. Any change was purely unintentional.    Enid Derry, PA-C 07/02/17 1818    Willy Eddy, MD 07/02/17 2032

## 2019-06-10 DEATH — deceased
